# Patient Record
Sex: Male | Born: 1935 | Race: White | Hispanic: No | Marital: Married
Health system: Southern US, Community
[De-identification: ages and names within clinical notes are randomized; demographics above are authoritative.]

---

## 2001-09-24 ENCOUNTER — Ambulatory Visit (HOSPITAL_COMMUNITY): Admission: RE | Admit: 2001-09-24 | Discharge: 2001-09-24 | Payer: Self-pay | Admitting: Gastroenterology

## 2001-09-24 ENCOUNTER — Encounter (INDEPENDENT_AMBULATORY_CARE_PROVIDER_SITE_OTHER): Payer: Self-pay | Admitting: Specialist

## 2002-04-25 ENCOUNTER — Ambulatory Visit (HOSPITAL_COMMUNITY): Admission: RE | Admit: 2002-04-25 | Discharge: 2002-04-25 | Payer: Self-pay | Admitting: Endocrinology

## 2004-10-05 ENCOUNTER — Ambulatory Visit: Payer: Self-pay

## 2005-05-26 ENCOUNTER — Ambulatory Visit: Payer: Self-pay

## 2005-09-26 ENCOUNTER — Ambulatory Visit: Payer: Self-pay | Admitting: Internal Medicine

## 2005-11-01 ENCOUNTER — Ambulatory Visit: Payer: Self-pay | Admitting: Internal Medicine

## 2005-11-15 ENCOUNTER — Ambulatory Visit: Payer: Self-pay | Admitting: Pulmonary Disease

## 2006-01-05 ENCOUNTER — Ambulatory Visit: Payer: Self-pay | Admitting: Internal Medicine

## 2006-02-03 ENCOUNTER — Ambulatory Visit: Payer: Self-pay | Admitting: Internal Medicine

## 2006-02-09 ENCOUNTER — Ambulatory Visit: Payer: Self-pay

## 2006-03-22 ENCOUNTER — Ambulatory Visit: Payer: Self-pay | Admitting: Internal Medicine

## 2006-04-24 ENCOUNTER — Ambulatory Visit: Payer: Self-pay | Admitting: Internal Medicine

## 2007-04-25 ENCOUNTER — Ambulatory Visit: Payer: Self-pay | Admitting: Internal Medicine

## 2007-06-06 ENCOUNTER — Ambulatory Visit: Payer: Self-pay | Admitting: Internal Medicine

## 2007-08-11 ENCOUNTER — Other Ambulatory Visit: Payer: Self-pay

## 2007-08-11 ENCOUNTER — Emergency Department: Payer: Self-pay | Admitting: Emergency Medicine

## 2007-08-22 ENCOUNTER — Emergency Department: Payer: Self-pay | Admitting: Internal Medicine

## 2007-08-22 ENCOUNTER — Other Ambulatory Visit: Payer: Self-pay

## 2007-08-31 ENCOUNTER — Ambulatory Visit: Payer: Self-pay | Admitting: Family Medicine

## 2007-10-15 ENCOUNTER — Encounter: Payer: Self-pay | Admitting: Internal Medicine

## 2007-10-15 DIAGNOSIS — J45909 Unspecified asthma, uncomplicated: Secondary | ICD-10-CM | POA: Insufficient documentation

## 2007-10-15 DIAGNOSIS — J4489 Other specified chronic obstructive pulmonary disease: Secondary | ICD-10-CM | POA: Insufficient documentation

## 2007-10-15 DIAGNOSIS — J9600 Acute respiratory failure, unspecified whether with hypoxia or hypercapnia: Secondary | ICD-10-CM

## 2007-10-15 DIAGNOSIS — E119 Type 2 diabetes mellitus without complications: Secondary | ICD-10-CM | POA: Insufficient documentation

## 2007-10-15 DIAGNOSIS — J449 Chronic obstructive pulmonary disease, unspecified: Secondary | ICD-10-CM

## 2007-10-15 DIAGNOSIS — E669 Obesity, unspecified: Secondary | ICD-10-CM

## 2008-06-11 ENCOUNTER — Ambulatory Visit: Payer: Self-pay | Admitting: Specialist

## 2010-12-03 ENCOUNTER — Encounter: Payer: Self-pay | Admitting: Otolaryngology

## 2010-12-23 ENCOUNTER — Encounter: Payer: Self-pay | Admitting: Otolaryngology

## 2011-03-08 NOTE — Assessment & Plan Note (Signed)
[facility]                             PULMONARY OFFICE NOTE   [name]                       MRN:          [id number]  DATE:04/25/2007                            DOB:          12-06-1923    HISTORY:  This is an 77 year old white male previously seen on April 24, 2006 with a diagnosis of COPD who did in fact have very mild airflow  obstruction by PFTs, but the predominant finding was one of morbid  obesity with deconditioning and probable reflux mimicking asthma. I had  recommended elimination of all respiratory medicines except for  nocturnal oxygen, and continue treatment for reflux. His treatment for  reflux changed some time ago to Zantac 150 mg every morning. His  breathing has worsened over the last 6 months to the point where he is  having trouble getting across the room without wheezing. He denies any  nocturnal symptoms and for that reason is not longer using oxygen but  says he feels terrible in the morning until he gets his arm and leg  exercises in (apparently he exercises on his back).   He denies any pleuritic pain, fevers, chills, sweats, or fluctuations in  his symptoms with weather or environmental changes, any obvious  alleviating or aggravating factors. He denies any chest pain, fevers,  chills, sweats, orthopnea, PND, or leg swelling.   For full inventory of medications please see face sheet dated April 25, 2007. His wife carries all of his medications on a card that is about  half of a 3 x 5, where as previously he was provided a full page  medication calendar here by a nurse practitioner that he no longer uses.   PHYSICAL EXAMINATION:  He is an ambulatory, obese white male in no acute  distress. Weight 272 pounds which is actually down 13 pounds from  previous visit. Blood pressure is 132/74.  HEENT: Unremarkable. Pharynx clear.  LUNG FIELDS: Completely clear bilaterally to auscultation and percussion  except for  prominent pseudo wheeze.  There is a regular rate and rhythm without murmur, gallop, or rub.  ABDOMEN: Obese, but otherwise benign with poor excursion noted in the  supine position.  EXTREMITIES: Warm without calf tenderness, cyanosis, clubbing. There was  trace edema.   Chest x-ray is pending. We walked the patient around the patient for a  full lap (185 feet). He did not desaturate but said he felt tired and  unable to continue at the end of the lap. He did not appear tachypneic.   IMPRESSION:  Worsening dyspnea over 6 months associated with pseudo  wheeze on exam. We could not produce desaturation on activity, although  I suspect he is still desaturating at night and recommended that he have  repeat pulse oximetry performed and oxygen supplied if his sats are 88%  or less for more than 5 minutes of the study.   In terms of improving his daytime function, I recommend the following;  Since the prominent pseudo wheeze was present previously on ACE  inhibitors that would indicate to me vocal cord dysfunction  that may be  exacerbated by reflux. I recommended restarting Prilosec perfectly  regular before meals and reviewed a diet with him. I asked him to  continue Zantac but take it at bedtime instead of in the morning.   I would like to see him back in 6 weeks for a full set of PFTs to  compare to previous study, which I think was actually done while on  bronchodilators to see if he is losing ground which indicate an  asthmatic component that is not being driven by reflux, but primary  airways disease which might be amenable to more aggressive treatment.   In the meantime I believe the patient needs to learn to exercise at a  pace where he is comfortable but not out of breath, 30 minutes daily.  Exercising on his back in the morning is going to be of no value in  this regard and I have advised him of this.     Charlaine Dalton. Sherene Sires, MD, [facility]  Electronically Signed    MBW/MedQ  DD:  04/25/2007  DT: 04/25/2007  Job #: [id number]

## 2011-03-08 NOTE — Assessment & Plan Note (Signed)
[facility]                             PULMONARY OFFICE NOTE   SANDEEP, RADELL                       MRN:          [id number]  DATE:06/06/2007                            DOB:          09-06-1924    HISTORY:  An 77 year old white male with a clinical diagnosis of COPD  but never smoked and returns now for a set of PFTs, reporting that he is  markedly improved off of all inhalers and using Protonix 40 mg before  breakfast, Zantac 75 mg tablets two at bedtime.  He denies any  significant cough, variability with dyspnea, fevers, chills, sweats,  orthopnea, PND or leg swelling.  For a full inventory of his  medications, please see Face Sheet, dated June 06, 2007, noting that  it correlates very well with the medication calendar he carries with  him.   PHYSICAL EXAMINATION:  He is a pleasant ambulatory white male in no  acute distress.  He is afebrile with normal vital signs.  HEENT:  Unremarkable.  OROPHARYNX:  Clear.  LUNG FIELDS:  Diminished breath sounds bilaterally, no wheezing.  HEART:  Regular rate and rhythm without murmur, gallop or rub.  ABDOMEN:  Soft but obese, benign.  EXTREMITIES:  Warm without any calf tenderness, cyanosis, clubbing or  edema.   PFTs were performed today and compared to previous study done a year and  a half ago and indicated an FEV1 at 2.08L, which is better than previous  visit, and no change in vital capacity or diffusing capacity which are  all normal.  The main finding is one of disproportionate reduction and  expiratory reserve volume at 38%.  Note that the PFTs done today were  off of all bronchodilators and he did not have a significant  bronchodilator response.   IMPRESSION:  No evidence of significant reversible airflow obstruction.  For that matter, no significant chronic obstructive pulmonary disease  either.  I believe most of his condition is obesity with deconditioning  and I explained this to him  and his wife and the strong recommendation  that he begin exercising to a level where he is short of breath but not  out of breath for 30 minutes daily.  If not improved over the next 6  weeks, we might consider proceeding with a CT scan of the chest to  exclude occult interstitial lung disease or thromboembolic disease, but  these are both very unlikely given the fact that his diffusion capacity  is 85%.   He does have significant resting tremor.  I would caution that one of  the issues here is that he may in fact have some form of chronic asthma  with an irreversible component that previously masqueraded as COPD and  could easily exacerbate if reflux is not well controlled or he ends up  on a nonspecific beta-blocker like propranolol (which may actually be  the case because he remembered getting something previously for tremor  but does not remember who gave it to him or what exactly it was.  I  would avoid beta-blockers in this setting.  Pulmonary followup at this point can be as needed if the patient is not  convinced he is improving with regular exercise.   In terms of his oxygen requirements, presently he does not need it at  rest or with exercise but clearly desaturates at night probably related  again to obesity and should continue this until significant weight loss  and a repeat study is done off of oxygen to verify that he has less than  5 minutes total of desaturation less than 89.     Charlaine Dalton. Sherene Sires, MD, [facility]  Electronically Signed    MBW/MedQ  DD: 06/06/2007  DT: 06/07/2007  Job #: [id number]   cc:   Alfonse Alpers. Dagoberto Ligas, M.D.  Georga Hacking, M.D.

## 2011-04-22 ENCOUNTER — Inpatient Hospital Stay: Payer: Self-pay | Admitting: Internal Medicine

## 2011-07-28 ENCOUNTER — Emergency Department: Payer: Self-pay | Admitting: Emergency Medicine

## 2012-05-14 ENCOUNTER — Inpatient Hospital Stay: Payer: Self-pay | Admitting: Specialist

## 2012-05-14 LAB — COMPREHENSIVE METABOLIC PANEL
Albumin: 3 g/dL — ABNORMAL LOW (ref 3.4–5.0)
Alkaline Phosphatase: 88 U/L (ref 50–136)
Anion Gap: 3 — ABNORMAL LOW (ref 7–16)
BUN: 21 mg/dL — ABNORMAL HIGH (ref 7–18)
Glucose: 101 mg/dL — ABNORMAL HIGH (ref 65–99)
Osmolality: 294 (ref 275–301)
Potassium: 4.2 mmol/L (ref 3.5–5.1)
SGOT(AST): 22 U/L (ref 15–37)
Sodium: 146 mmol/L — ABNORMAL HIGH (ref 136–145)
Total Protein: 7.7 g/dL (ref 6.4–8.2)

## 2012-05-14 LAB — CBC WITH DIFFERENTIAL/PLATELET
Basophil %: 0.3 %
Eosinophil %: 2.4 %
HGB: 13.9 g/dL (ref 13.0–18.0)
Lymphocyte %: 14.2 %
MCH: 28.9 pg (ref 26.0–34.0)
Monocyte %: 6.7 %
Neutrophil %: 76.4 %
RBC: 4.8 10*6/uL (ref 4.40–5.90)
WBC: 9.6 10*3/uL (ref 3.8–10.6)

## 2012-05-14 LAB — PRO B NATRIURETIC PEPTIDE: B-Type Natriuretic Peptide: 284 pg/mL (ref 0–450)

## 2012-05-15 LAB — CBC WITH DIFFERENTIAL/PLATELET
Eosinophil %: 0.1 %
HCT: 38.5 % — ABNORMAL LOW (ref 40.0–52.0)
Lymphocyte #: 0.5 10*3/uL — ABNORMAL LOW (ref 1.0–3.6)
MCHC: 32.2 g/dL (ref 32.0–36.0)
MCV: 92 fL (ref 80–100)
Platelet: 200 10*3/uL (ref 150–440)
RDW: 14.4 % (ref 11.5–14.5)

## 2012-05-15 LAB — BASIC METABOLIC PANEL
Anion Gap: 4 — ABNORMAL LOW (ref 7–16)
BUN: 20 mg/dL — ABNORMAL HIGH (ref 7–18)
Calcium, Total: 8 mg/dL — ABNORMAL LOW (ref 8.5–10.1)
Chloride: 108 mmol/L — ABNORMAL HIGH (ref 98–107)
Osmolality: 295 (ref 275–301)
Potassium: 4.2 mmol/L (ref 3.5–5.1)
Sodium: 144 mmol/L (ref 136–145)

## 2012-05-20 LAB — CULTURE, BLOOD (SINGLE)

## 2012-10-27 IMAGING — US US EXTREM LOW VENOUS*L*
1 series · 14 of 24 positions shown · non-contrast
Comparison: none

REASON FOR EXAM: swelling of left leg
COMMENTS:

[Series 1: us extrem low venous*left* · 0.13mm/px · 14 of 24 slices shown]
[im 1/24]
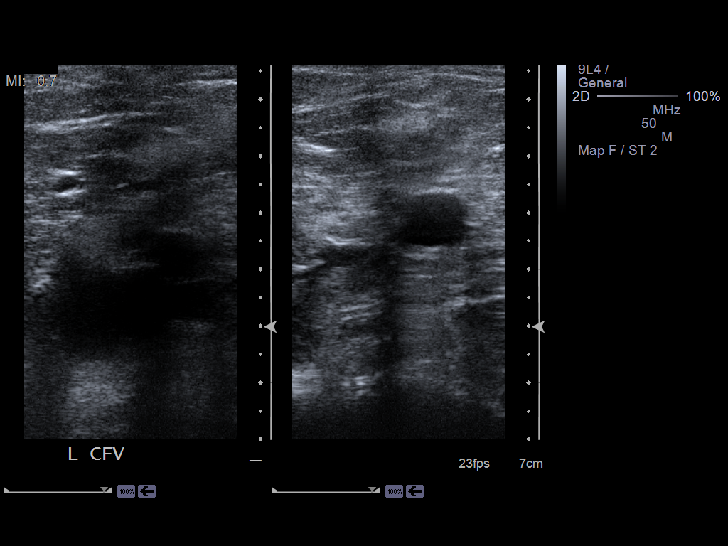
[im 3/24]
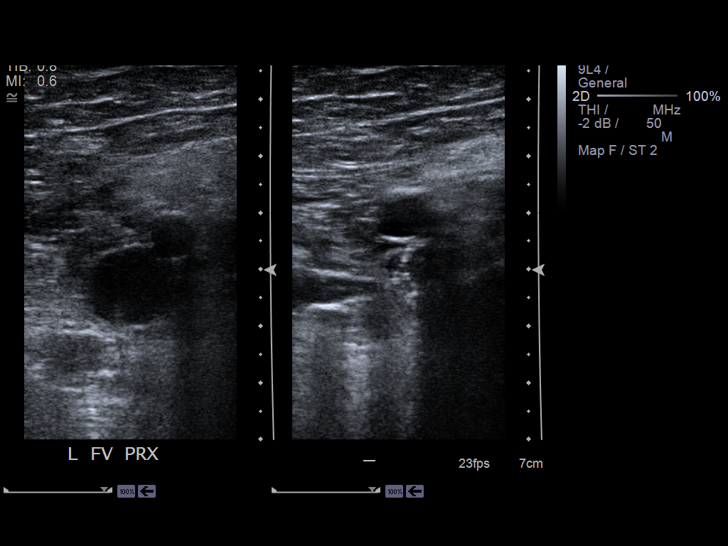
[im 5/24]
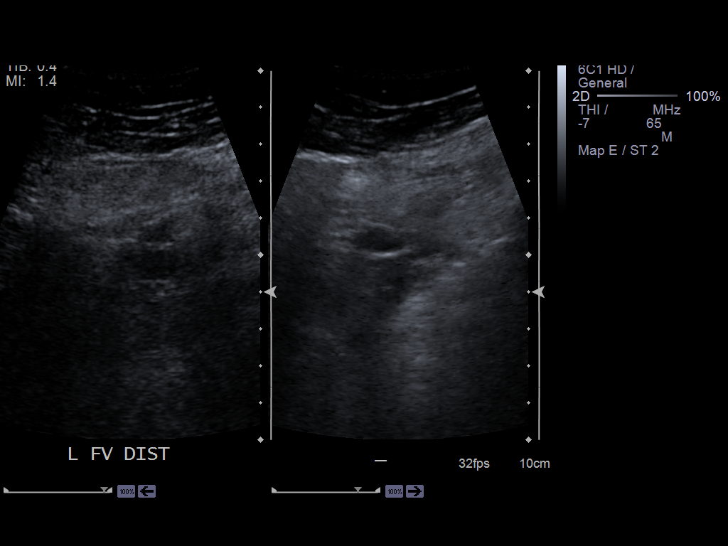
[im 7/24]
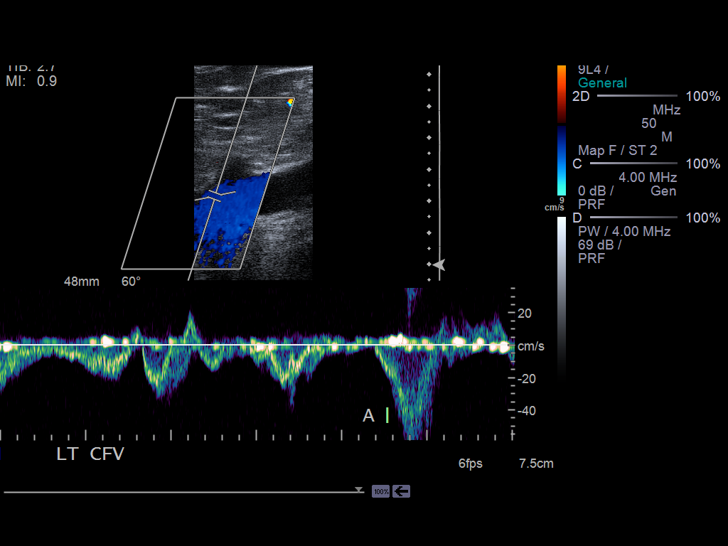
[im 8/24]
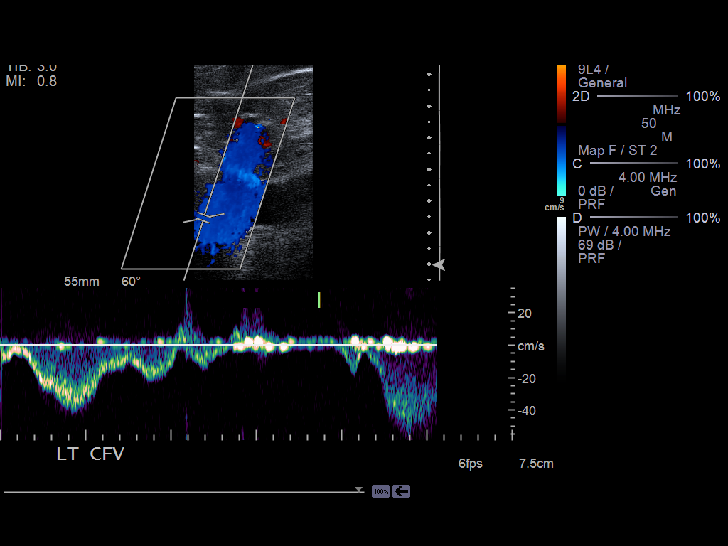
[im 10/24]
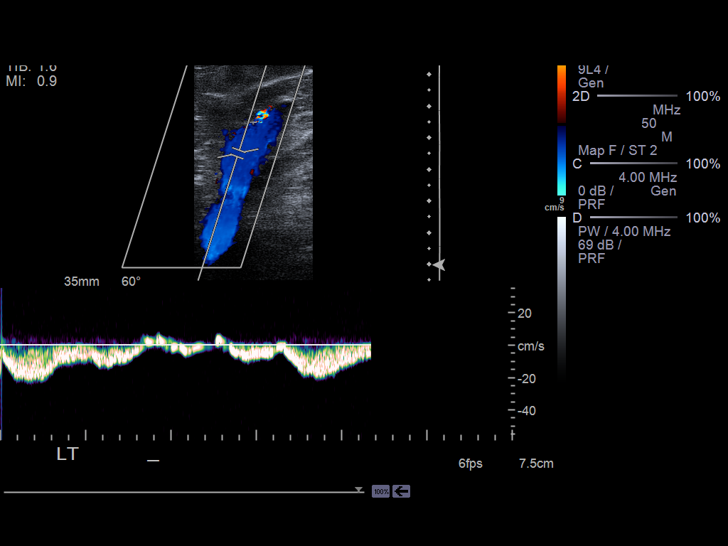
[im 12/24]
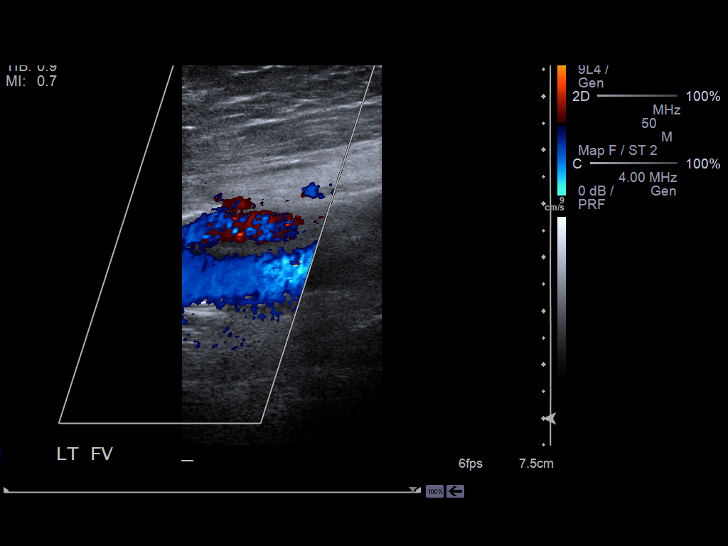
[im 13/24]
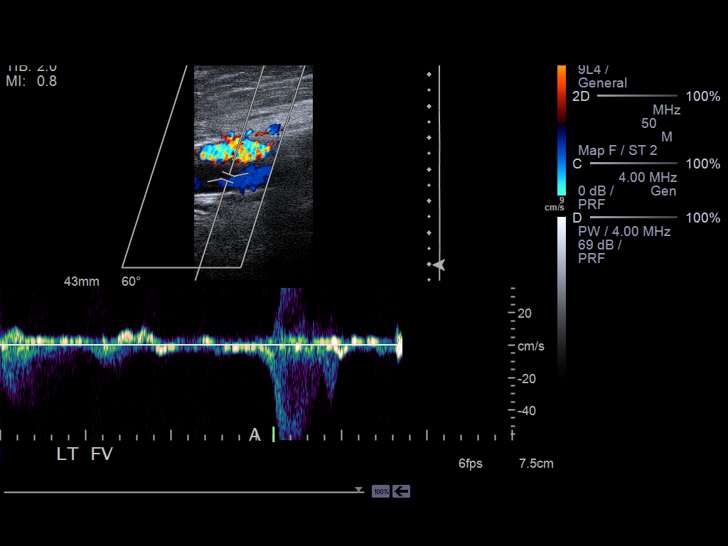
[im 15/24]
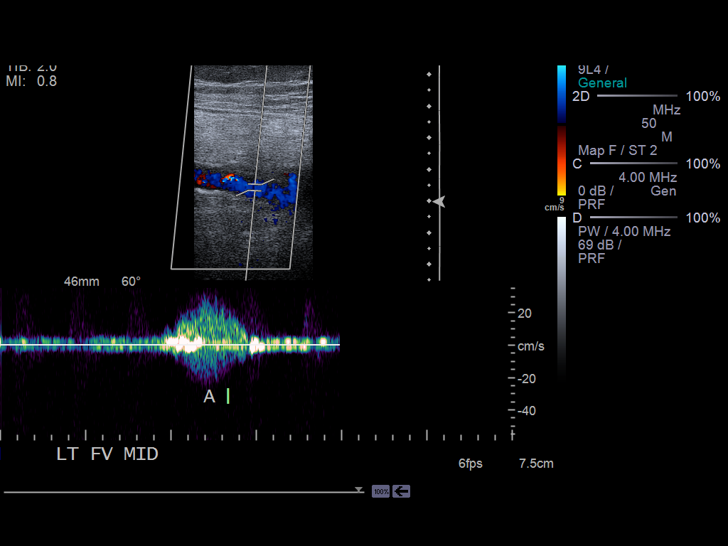
[im 17/24]
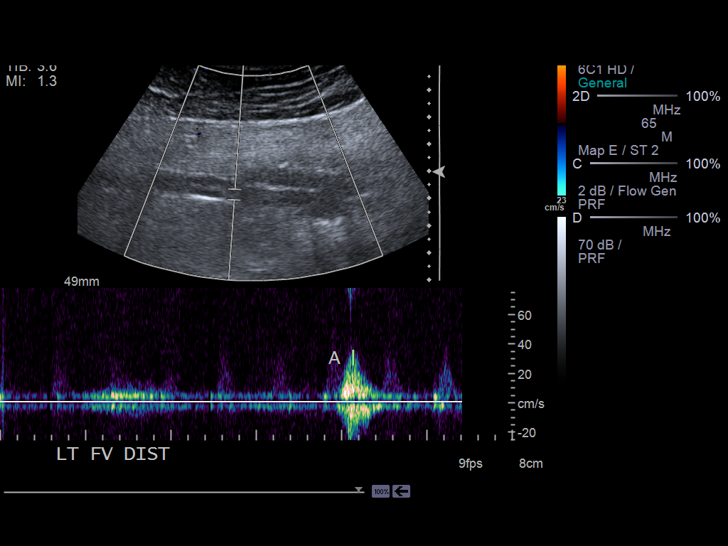
[im 19/24]
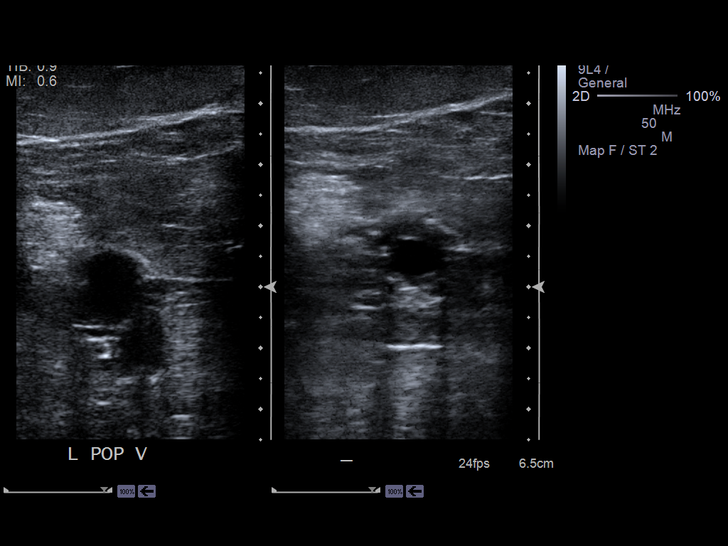
[im 20/24]
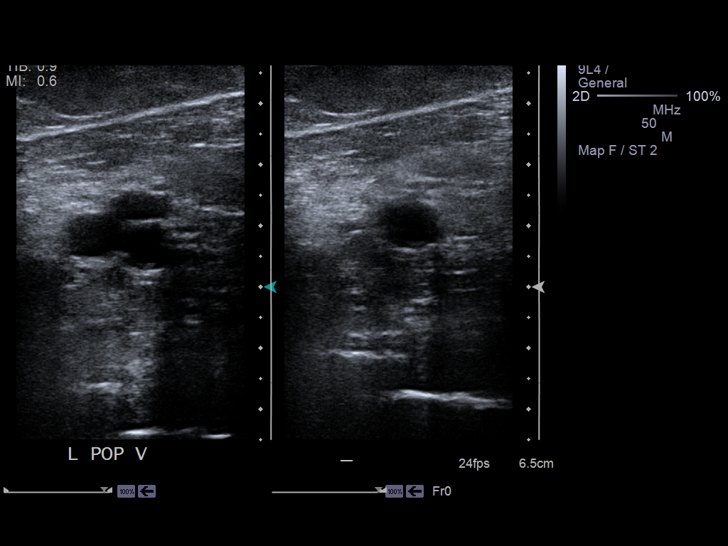
[im 22/24]
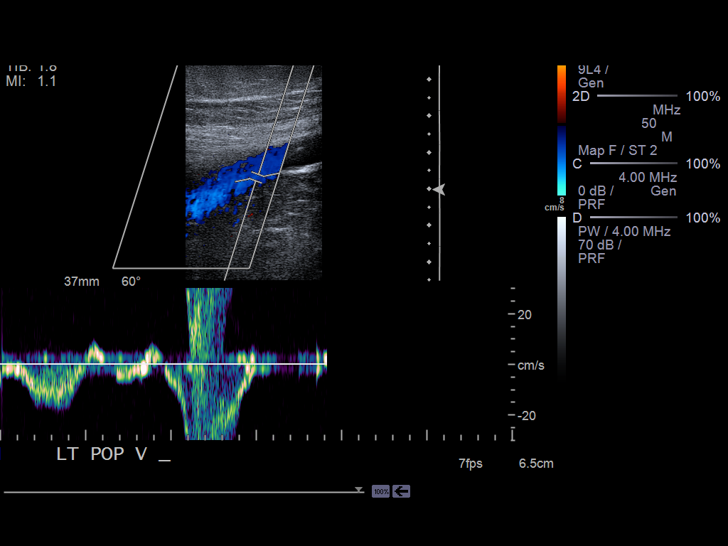
[im 24/24]
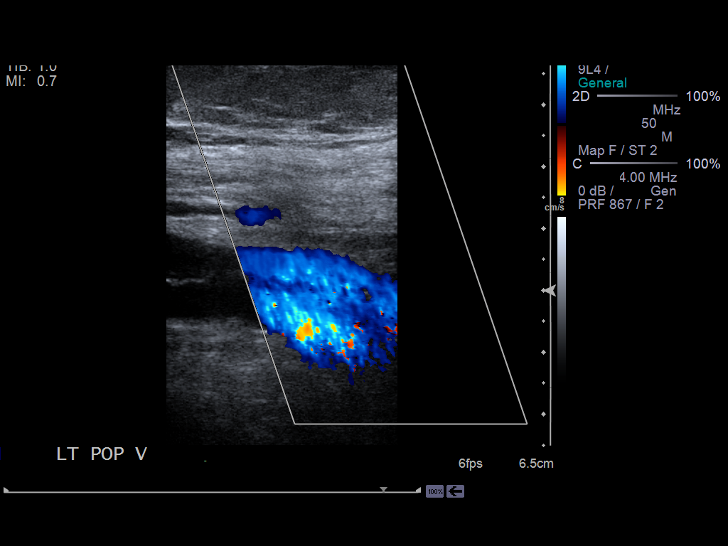

[14 of 24 positions shown; findings below may reference images not displayed]

PROCEDURE:     US  - US DOPPLER LOW EXTR LEFT  - May 15, 2012  [DATE]

RESULT:     Doppler interrogation of the deep venous system of the left leg
is performed from the common femoral vein through the popliteal vein.
Grayscale images showed normal compressibility of the deep venous
structures. Color and spectral Doppler interrogation shows a normal
appearance.
IMPRESSION: 1. No evidence of left lower extremity deep vein thrombosis. No popliteal
fossa cyst evident.

[REDACTED]

## 2012-10-29 LAB — CBC
MCH: 27.4 pg (ref 26.0–34.0)
MCHC: 30.1 g/dL — ABNORMAL LOW (ref 32.0–36.0)
Platelet: 221 10*3/uL (ref 150–440)
RBC: 4.2 10*6/uL — ABNORMAL LOW (ref 4.40–5.90)
RDW: 15.7 % — ABNORMAL HIGH (ref 11.5–14.5)

## 2012-10-29 LAB — COMPREHENSIVE METABOLIC PANEL
Albumin: 2.6 g/dL — ABNORMAL LOW (ref 3.4–5.0)
Anion Gap: 4 — ABNORMAL LOW (ref 7–16)
Calcium, Total: 8.7 mg/dL (ref 8.5–10.1)
Chloride: 104 mmol/L (ref 98–107)
Co2: 35 mmol/L — ABNORMAL HIGH (ref 21–32)
Creatinine: 1.19 mg/dL (ref 0.60–1.30)
Glucose: 174 mg/dL — ABNORMAL HIGH (ref 65–99)
Osmolality: 290 (ref 275–301)
Potassium: 4.1 mmol/L (ref 3.5–5.1)
SGOT(AST): 15 U/L (ref 15–37)
SGPT (ALT): 10 U/L — ABNORMAL LOW (ref 12–78)
Sodium: 143 mmol/L (ref 136–145)

## 2012-10-29 LAB — URINALYSIS, COMPLETE
Bilirubin,UR: NEGATIVE
Glucose,UR: NEGATIVE mg/dL (ref 0–75)
Ketone: NEGATIVE
Nitrite: POSITIVE
Ph: 5 (ref 4.5–8.0)
Protein: 30
Specific Gravity: 1.012 (ref 1.003–1.030)
Squamous Epithelial: <1
WBC UR: 201 /HPF (ref 0–5)

## 2012-10-30 ENCOUNTER — Inpatient Hospital Stay: Payer: Self-pay | Admitting: Internal Medicine

## 2012-10-31 LAB — CBC WITH DIFFERENTIAL/PLATELET
Basophil #: 0 10*3/uL (ref 0.0–0.1)
Basophil %: 0.2 %
Eosinophil #: 0 10*3/uL (ref 0.0–0.7)
Eosinophil %: 0.4 %
HGB: 10.1 g/dL — ABNORMAL LOW (ref 13.0–18.0)
Lymphocyte #: 1.6 10*3/uL (ref 1.0–3.6)
MCH: 28.5 pg (ref 26.0–34.0)
MCV: 90 fL (ref 80–100)
Monocyte #: 0.6 x10 3/mm (ref 0.2–1.0)
Monocyte %: 7.3 %
Neutrophil %: 72.2 %
Platelet: 205 10*3/uL (ref 150–440)
RBC: 3.54 10*6/uL — ABNORMAL LOW (ref 4.40–5.90)

## 2012-10-31 LAB — BASIC METABOLIC PANEL
Anion Gap: 5 — ABNORMAL LOW (ref 7–16)
Calcium, Total: 8.4 mg/dL — ABNORMAL LOW (ref 8.5–10.1)
Chloride: 105 mmol/L (ref 98–107)
Co2: 35 mmol/L — ABNORMAL HIGH (ref 21–32)
EGFR (Non-African Amer.): >60
Glucose: 98 mg/dL (ref 65–99)
Sodium: 145 mmol/L (ref 136–145)

## 2012-11-04 LAB — CULTURE, BLOOD (SINGLE)

## 2013-02-21 DEATH — deceased

## 2013-04-12 IMAGING — CR DG CHEST 1V PORT
1 series · 1 of 1 positions shown · non-contrast
Comparison: none

REASON FOR EXAM: SOB
COMMENTS:

PROCEDURE:     DXR - DXR PORTABLE CHEST SINGLE VIEW  - October 29, 2012 [DATE]
RESULT:     Comparison: 05/14/2012

[ap]
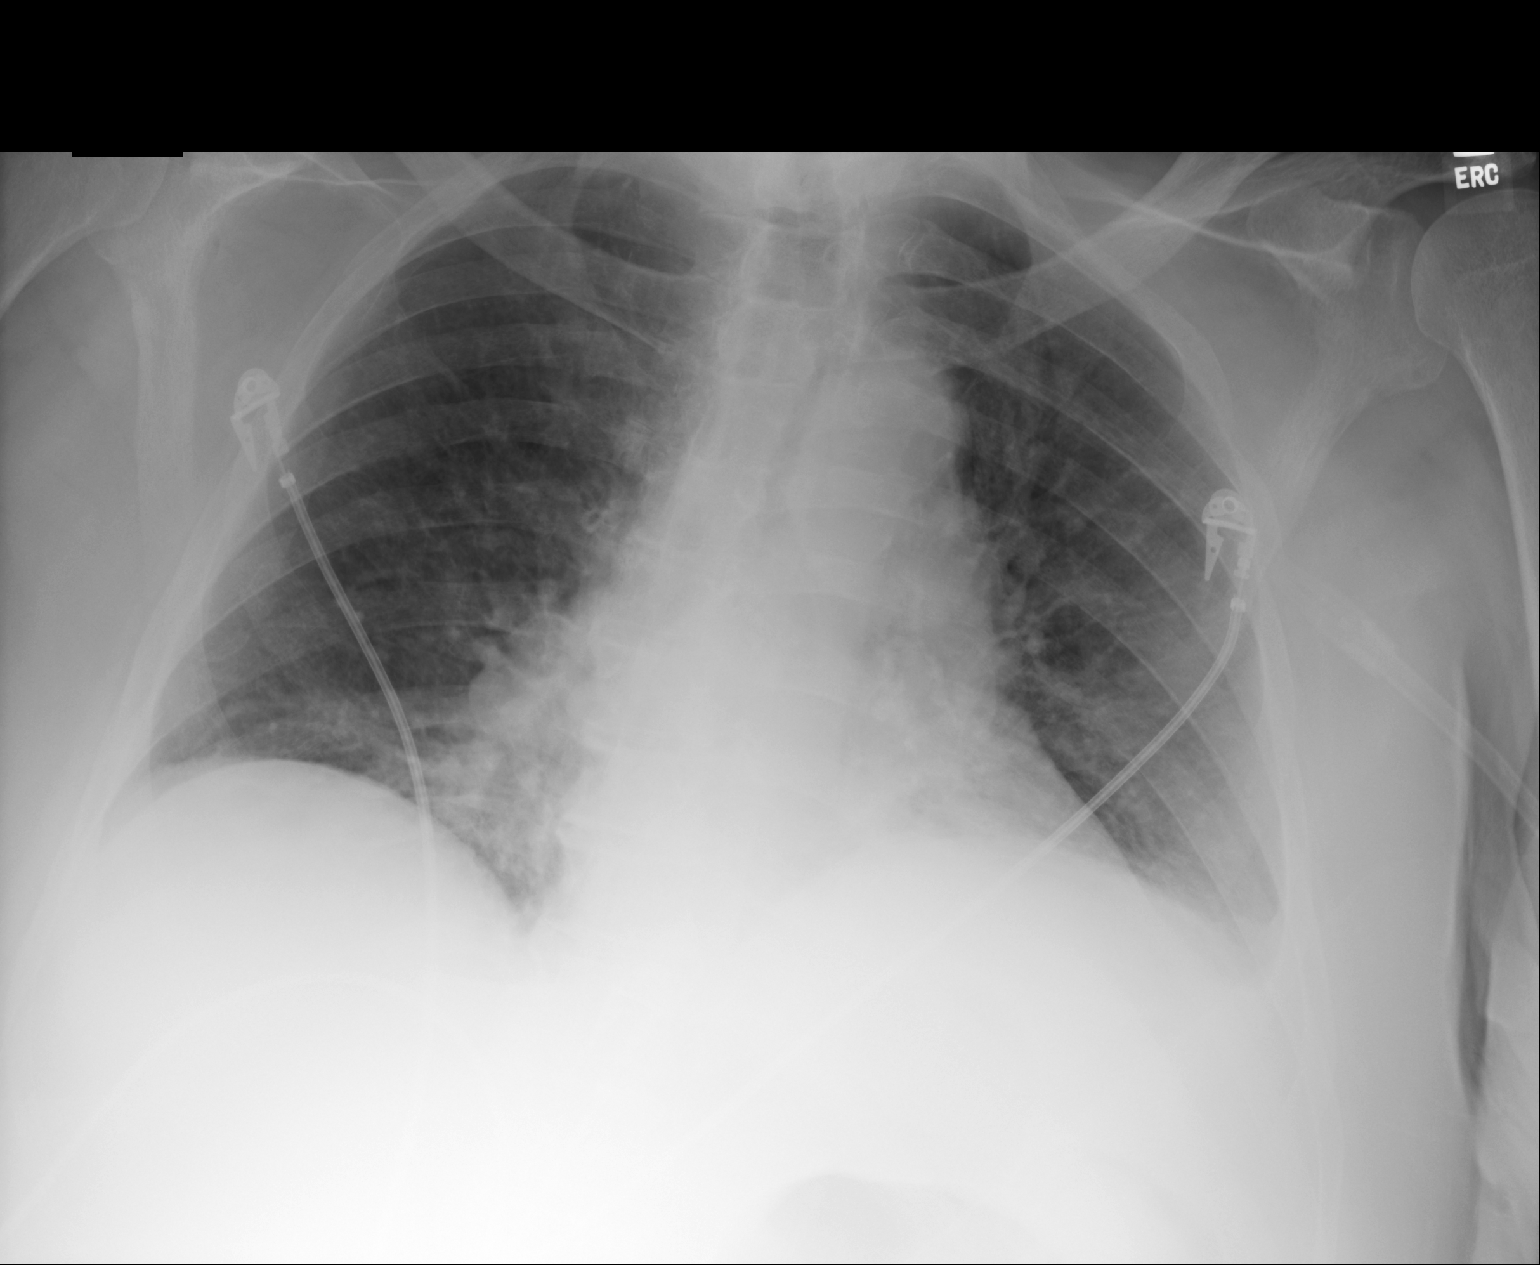

[1 of 1 positions shown; findings below may reference images not displayed]

FINDINGS: Single portable AP chest radiograph is provided. There is right lower lobe
airspace disease concerning for pneumonia. There is no pleural effusion or
pneumothorax. There is mild bilateral interstitial thickening. Normal
cardiomediastinal silhouette. The osseous structures are unremarkable.
IMPRESSION: Right lower lobe airspace disease concerning for pneumonia. Recommend
dedicated PA and lateral views of the che[REDACTED]

## 2015-02-10 NOTE — Discharge Summary (Signed)
PATIENT NAME:  [name], [name] MR#:  [id number] DATE OF BIRTH:  07/11/1924  DATE OF ADMISSION:  05/14/2012 DATE OF DISCHARGE:  05/16/2012  For a detailed note, please take a look at the history and physical done on admission by Dr. Auburn BilberryShreyang Patel.  DISCHARGE DIAGNOSES: 1. Left lower extremity cellulitis and pressure ulcer.  2. Chronic obstructive pulmonary disease with mild exacerbation.  3. History of underlying Parkinson's disease.  4. Benign prostatic hypertrophy.  5. Depression.   DIET: The patient is being discharged on a low-sodium diet.   ACTIVITY: As tolerated.   DISPOSITION: The patient is being discharged home with home health nursing and aid services.   DISCHARGE FOLLOWUP: Followup with Dr. Recardo EvangelistMatthew Troxler in 1 to 2 weeks. [facility], Rolin BarryMario Olmedo.   DISCHARGE MEDICATIONS:  1. Celexa 20 mg daily.  2. Aspirin 81 mg daily.  3. Advair 250/50 one puff twice a day. 4. Lasix 20 mg daily.  5. Flomax 0.4 mg daily.  6. Q-Var 40 mcg 2 puffs twice a day. 7. Vitamin B12 1000 mcg daily.  8. Seroquel 50 mg 1/2 tab at bedtime.  9. Augmentin 500/125 mg one tab twice a day x7 days. 10. Prednisone taper starting at 40 mg down to 10 mg over the next four days. 11. Bactroban to be applied to the left heel ulcer with heavy gauze dressing and use of pressure boots.  CONSULTANTS: Recardo EvangelistMatthew Troxler, MD - Podiatry.   PERTINENT STUDIES: Chest x-ray done on admission showed no evidence of acute cardiopulmonary disease.  X-ray of the left foot showed osteopenia without evidence of any acute osseous abnormalities.   Ultrasound of the left lower extremity showed no evidence of any deep vein thrombosis.   HOSPITAL COURSE: This is an 81 year old male with medical problems as mentioned above who presented to the hospital with left foot cellulitis and also mild chronic obstructive pulmonary disease exacerbation.  1. Left foot cellulitis/ulcer. The patient was  empirically started on IV vancomycin and Unasyn. His blood cultures remained negative. His white cell count was normal. He remained afebrile. A podiatry consult was obtained. The patient underwent local wound care with dressing changes as per podiatry. He is being discharged on that, on the Bactroban, with a heavy gauze dressing along with the use of pressure boots. Dr. Orland Jarredroxler is going to evaluate his wound in a week or so. He is being discharged empirically on p.o. Augmentin for the next five days.  2. Chronic obstructive pulmonary disease exacerbation. The patient was started on IV steroids and some nebulizer treatments. His wheezing and clinical symptoms have significantly improved. For now he will be discharged on a quick prednisone taper along with continuation of his Advair as stated.  3. Benign prostatic hypertrophy. The patient was maintained on his Flomax. He will resume that.  4. Depression. The patient was maintained on Celexa and Seroquel. He will resume that upon discharge too.   CODE STATUS: FULL CODE.           He is being discharged home with home health nursing and aide services.   TIME SPENT: 40 minutes. ____________________________ Rolly PancakeVivek J. Cherlynn KaiserSainani, MD vjs:slb D: 05/16/2012 15:53:38 ET T: 05/17/2012 11:56:08 ET JOB#: [id number]  cc: Rolly PancakeVivek J. Cherlynn KaiserSainani, MD, <Dictator> Dione HousekeeperMario Ernesto Olmedo, MD Houston SirenVIVEK J SAINANI MD ELECTRONICALLY SIGNED 05/18/2012 16:19

## 2015-02-10 NOTE — Consult Note (Signed)
PATIENT NAME:  [name], [name] MR#:  [id number] DATE OF BIRTH:  08/25/1924  DATE OF CONSULTATION:  05/14/2012  REFERRING PHYSICIAN:   CONSULTING PHYSICIAN:  Rhona RaiderMatthew G. Hamzah Savoca, DPM  REASON FOR CONSULTATION: Left heel ulcer.  HISTORY OF PRESENT ILLNESS: The patient is an 81 year old male, he is known to me. I have treated him before in the office for diabetic related foot problems. He was admitted with cellulitis to his left leg, also with some shortness of breath associated with his congestive heart failure. He is a type II diabetic and developed a decubitus ulcer on his left heel. He also has Parkinson's disease and he is not able to really ambulate very much now and he is in bed a lot and that is probably what caused the pressure sore to develop.  PAST MEDICAL HISTORY:  1. Chronic obstructive pulmonary disease. 2. Sleep apnea, intolerant to CPAP. Uses oxygen at nighttime. 3. Congestive heart failure. 4. Type 2 diabetes. 5. Peripheral neuropathy. 6. Parkinson's disease. 7. Reflux.   PAST SURGICAL HISTORY:  1. Bilateral knee replacement, in 2003 and 2004. 2[place]  3. Status post cataract surgery.   ALLERGIES: No known drug allergies.   CURRENT MEDICATIONS: Advair, aspirin, Citalopram, Lasix, Q-var, Seroquel, Flomax, vitamin B12 and albuterol/Atrovent inhaler.   SOCIAL HISTORY: Unremarkable.   FAMILY HISTORY: Positive for diabetes, cancer, cerebrovascular accident, and coronary artery disease. Father died at 8390, mother at 794.  PHYSICAL EXAMINATION:  GENERAL:  He is alert and is oriented well. He is able to communicate and recognize me fairly well. His daughter is in the room with him at this timeframe.  VITALS: Right now his vital signs are stable with a temperature of 97.4, pulse 76, respirations 20, blood pressure 151/66, and pulse oximetry 93.  LOWER EXTREMITY EXAM: Vascular DP and PT pulses are trace bilaterally.   DERMATOLOGIC: The patient has large  decubitus blister on his right heel; it is about 6 cm in diameter.   I did debride off the superficial portion of it so I could assess the deeper aspect of it. It appears to be superficial at this point, but we will have to see what type of deeper tissue damage may have occurred with this as well. He is wearing heel protector boots at this given time. His cellulitis is fairly minimal. I really do not see any spreading cellulitis around the area. He seems to be responding to antibiotics very well.   ORTHO: The patient is basically nonambulatory at this point. He has some contractures of his feet and legs secondary to his parkinsonism including intramuscular weakness and contractures across the toes and the metatarsophalangeal joints.   RESULTS: Lab results show that his white count is within normal limits at 8.1 and hemoglobin is a  little low at 12.4. BUN is elevated slightly at 20. Glucose is 201, serum, today. Hemoglobin A1c is 6.4.   CLINICAL IMPRESSION: Decubitus ulcer, left heel, 6 cm in diameter. No real progressing cellulitis around the region. The wound appears to be superficial, but will need to be followed and protected to prevent further damage.   TREATMENT PLAN: Debride and clean the area today and apply heavy gauze padding with Kerlix and moist dressing. Continue to use the heel protector boots at all times. Hopefully we will be sending those home with him.  Recommend home health care to change the dressing daily with Bactroban ointment and heavy gauze padding around the region. Can use a little saline  or hydrogel type dressing in conjunction with the Bactroban to keep the area moist and promote more healing to the region. Recommend I follow him up in the office in a couple of weeks for recheck and see how well this is doing.  ____________________________ Rhona Raider. Baylyn Sickles, DPM mgt:slb D: 05/15/2012 12:14:48 ET T: 05/15/2012 13:04:46 ET JOB#: [id number]  cc: Rhona Raider Eustacio Ellen, DPM,  <Dictator> Epimenio Sarin MD ELECTRONICALLY SIGNED 05/21/2012 13:16

## 2015-02-10 NOTE — H&P (Signed)
PATIENT NAME:  [name], [name] MR#:  [id number] DATE OF BIRTH:  1924-09-04  DATE OF ADMISSION:  05/14/2012  PRIMARY CARE PHYSICIAN: Jerrilyn Cairouke [place]  ED REFERRING PHYSICIAN: Dr. Jene Everyobert Kinner   CHIEF COMPLAINT: Left leg erythema, also area of drainage on the left lateral aspect of the left foot as well as shortness of breath.   HISTORY OF PRESENT ILLNESS: Patient is an 81 year old Caucasian male with history of chronic obstructive pulmonary disease who is on oxygen mostly at nighttime, also has sleep apnea, history of diastolic congestive heart failure, diabetes type 2 diet controlled, peripheral neuropathy and Parkinson's disease who is sent from his primary care's office due to concern for left leg cellulitis. Patient is not a very good historian. His wife is the one that gives me all the history. She reports that last Saturday she noticed an area on the patient's lateral aspect of his left foot an area of blister that popped up all of a sudden and she initially noticed there was fluid and then subsequently she started noticing that there was change in the color, it looked more bloody and then started draining. He was seen by his primary care provider. He was also noticed to have erythema involving the left lower leg as well as warmth so he was sent here for further evaluation. On further questioning his wife also reports he has been having more shortness of breath, having more frequency in wheezing. Has not had any significant cough or fevers. She reports that he does use oxygen at nighttime but he has been using it at daytime as well because of this. Patient also has been having dyspnea with any exertion. He reports that he also gets chest pressure with exertion. He has not had any fevers or chills. No nocturnal dyspnea. According to his wife he also has chronic left lower extremity swelling compared to the right leg.   PAST MEDICAL HISTORY:  1. Chronic obstructive pulmonary disease. He only uses O2 at  nighttime, has a nebulizer machine, however, his wife forgot that he had it and has not received that. 2. Sleep apnea, intolerant to CPAP, only uses O2 at nighttime. 3. History of diastolic congestive heart failure with ejection fraction greater than 60%.  4. Type 2 diabetes currently not on any medications.  5. Peripheral neuropathy.  6. Parkinson's disease. 7. History of ambulatory gait dysfunction. 8. Gastroesophageal reflux disease.   PAST SURGICAL HISTORY:  1. Status post bilateral knee replacement 2003 and 2004. 2. [place] 3. Status post cataract surgery.   ALLERGIES: None.   CURRENT MEDICATIONS AT HOME:  1. Advair 250/50, 1 INH at bedtime. 2. Aspirin 81, 1 tab p.o. daily.  3. Citalopram 20 daily.  4. Lasix 40 mg 0.5 to 1 tab daily as his wife feels appropriate for swelling. 5. Qvar 40 mcg 2 puffs b.i.d.  6. Seroquel 50 0.5 tab at bedtime.  7. Flomax 0.4 at bedtime.  8. Vitamin B12 1000 units 1 tab p.o. daily.  9. Albuterol Atrovent nebs as needed, however, has not been using this recently.   SOCIAL HISTORY: No smoking. No alcohol. No drug use. Lives with his wife.   FAMILY HISTORY: Positive for diabetes, CVA, cancer, coronary artery disease. Father died at 7190, mother died at 4494.   REVIEW OF SYSTEMS: CONSTITUTIONAL: Denies any fevers. Complains of fatigue, weakness. Complains of pain in the left leg. No weight loss. No weight gain. EYES: No blurred or double vision. No pain. No  redness. No inflammation. ENT: No tinnitus. No ear pain. No hearing loss. No difficulty swallowing. RESPIRATORY: Has wheezing, occasional cough. No hemoptysis. Has chronic dyspnea. Has diagnosis of chronic obstructive pulmonary disease. CARDIOVASCULAR: Denies some chest pressure with exertion. No orthopnea. Has edema in the lower extremity, left greater than right. No arrhythmias. No syncope. GASTROINTESTINAL: No nausea, vomiting, diarrhea. No abdominal pain. No hematemesis.  No melena. No gastroesophageal reflux disease. No irritable bowel syndrome. No jaundice. GENITOURINARY: Denies any dysuria, hematuria, renal calculus or frequency. ENDO: Denies any polyuria, nocturia, or thyroid problems. HEME/LYMPH: Denies anemia, easy bruisability, or bleeding. SKIN: No acne. Erythema involving the left leg which is new. No changes in mole, hair or skin. MUSCULOSKELETAL: Denies any pain in neck, back, or shoulder. Has chronic pain related to osteoarthritis. No gout. NEUROLOGIC: No numbness. No cerebrovascular accident. No transient ischemic attack. Has Parkinson's disease. PSYCHIATRIC: No anxiety, insomnia. No ADD.   PHYSICAL EXAMINATION:  VITAL SIGNS: Temperature 97.2, pulse 70, respirations 24, blood pressure 140/67, O2 96% on room air.   GENERAL: Patient is an elderly-appearing male appears chronically debilitated in no acute distress.   HEENT: Head atraumatic, normocephalic. Pupils equal, round, reactive to light and accommodation. There is no conjunctival pallor. No scleral icterus. Extraocular movements intact. Nasal exam shows no drainage, ulceration. Oropharynx is clear without any exudate.   NECK: No thyromegaly. No carotid bruits.   RESPIRATORY: Occasional wheezing. There is no accessory muscle usage. No crackles or rales.   CARDIOVASCULAR: Regular rate and rhythm. S1, S2 positive. No murmurs, rubs, clicks, or gallops. PMI is not displaced.   ABDOMEN: Soft, nontender, nondistended. Positive bowel sounds x4. There is no hepatosplenomegaly.   EXTREMITIES: He has got left leg swelling more than right leg.   MUSCULOSKELETAL: There is no erythema or swelling.   SKIN: He has erythema involving the left leg down from his knee on his shin. There is warmth on the left leg. Also, there is area of large blister on the left lateral aspect of the leg. Currently is not draining. Seems to have persistent fluid there, also warmth and erythema around this area as well.    LYMPHATICS: No lymphadenopathy noted.   NEUROLOGICAL: Cranial nerves II through XII grossly intact. Deep tendon reflexes intact. Sensation intact.   PSYCHIATRIC: Awake, alert, oriented x3.   LABORATORY, DIAGNOSTIC AND RADIOLOGICAL DATA: WBC 9.6, hemoglobin 13.9, platelet count 223. BMP: Glucose 101, BUN 21, creatinine 1.23, sodium 146, potassium 4.2, chloride 109, CO2 34. LFTs were normal except albumin of 3.0. Left foot x-ray shows osteopenia without evidence of acute osseous abnormality. PA and lateral shallow inspiration without evidence of acute cardiopulmonary disease.    ASSESSMENT AND PLAN: Patient is an 81 year old white male with history of Parkinson's disease, chronic obstructive pulmonary disease, diastolic congestive heart failure sent from primary M.D. for left leg cellulitis with area of blister on the left lateral aspect of the left heel.  1. Left leg cellulitis with a possible abscess. At this time will treat him with IV antibiotics with Unasyn, vancomycin. I will have podiatry evaluate the patient. He may need further drainage.  2. Shortness of breath likely due to acute chronic obstructive pulmonary disease exacerbation. Will place him on IV Solu-Medrol as well as nebulizers. Will check a BNP level. Continue his Lasix. His chest x-ray shows no acute cardiopulmonary processes.  3. Left leg swelling for about one year duration. Will make sure that there is no deep vein thrombosis.  4. Diabetes, diet controlled. Will place  him on sliding scale.  5. Parkinson's disease. He is currently not on any treatment for that.  6. Benign prostatic hypertrophy. Continue Flomax as taking at home.  7. History of diastolic congestive heart failure. Will check a BMP. His chest x-ray is negative. There is no evidence of acute diastolic congestive heart failure. Will follow his ins and outs.  8. Peripheral neuropathy.  9. Miscellaneous. Will place him on Lovenox for deep vein thrombosis prophylaxis.    TIME SPENT: 40 minutes.   ____________________________ Lacie Scotts Allena Katz, MD shp:cms D: 05/14/2012 17:39:31 ET T: 05/15/2012 05:51:26 ET JOB#: [id number]  cc: Kimber Esterly H. Allena Katz, MD, <Dictator> [facility] [place] Charise Carwin MD ELECTRONICALLY SIGNED 05/22/2012 12:13

## 2015-02-13 NOTE — Discharge Summary (Signed)
PATIENT NAME:  [name], [name] DATE OF BIRTH:  May 10, 1924  DATE OF ADMISSION:  10/30/2012 DATE OF DISCHARGE:  11/01/2012  ADMISSION DIAGNOSES:  1.  Right lower lobe pneumonia.  2.  Urinary tract infection.   DISCHARGE DIAGNOSES:  1.  Right lower lobe pneumonia, likely aspiration versus community-acquired pneumonia.  2.  Escherichia coli urinary tract infection.  3.  Chronic obstructive pulmonary disease.  4.  Dementia, Severe  5.  Diastolic congestive heart failure.  6.  History of obstructive sleep apnea.  7.  Parkinson disease.  8.  Type 2 diabetes.  9.  Peripheral neuropathy. 10.  Benign prostatic hypertrophy.  11.  Depression.  12.  Gastroesophageal reflux disease.   CONSULTATIONS: None.   LABORATORIES AT DISCHARGE: White blood cells 7.9, hemoglobin 10, hematocrit 32, platelets 205. Sodium 145, potassium 3.9, chloride 105, bicarbonate 35, BUN 22, creatinine 1.03, glucose 98. Urine culture showed E. coli sensitive to ciprofloxacin. Blood cultures are negative to date.   Chest x-ray showed right lower lobe airspace disease concerning for pneumonia.   HOSPITAL COURSE: This is an 81 year old male with severe dementia who was brought in with fever and was found to have a pneumonia. For further details, please refer to the H and P.  1.  Right lower lobe pneumonia, possibly aspiration versus community-acquired pneumonia. The patient does have aspiration due to severe dementia. I suspect that this is more likely related to his aspiration. He was placed on aspiration precautions and started on Levaquin. Blood cultures are negative to date. He was also started on nebulizers p.r.n. and speech therapy evaluated and they recommend dysphagia diet due to aspiration from his dementia.  2.  E. coli UTI. The patient was on Levaquin and will continue this as an outpatient.  3.  History of COPD, which is stable.  4.  Dementia, severe. The patient has hospice at home.  5.  Parkinson  disease.  6.  History of OSA. The patient did not tolerate CPAP in the past.  7.  Diastolic congestive heart failure, compensated with a normal ejection fraction. 8.  Type 2 diabetes, on no treatment.  9.  History of peripheral neuropathy, which was not treated at this time.  10.  History of BPH.  11.  GERD.   DISCHARGE MEDICATIONS:  1.  Citalopram 20 mg daily.  2.  Aspirin 81 mg daily.  3.  Advair Diskus 250/50 at bedtime.  4.  Lasix 40 mg 1/2 tablet to 1 tablet daily.  5.  Tamsulosin 0.4 mg at bedtime.  6.  QVAR 40 mcg 2 puffs b.i.d. p.r.n. wheezing.  7.  Vitamin B12, 1000 mcg daily.  8.  Seroquel 50 mg 1/2 tablet at bedtime.  9.  Prednisone taper.  10.  Bactroban 2% topical to affected area daily.  11.  Acetaminophen 325, 2 tablets q.4 hours p.r.n.  12.  Levaquin 750 mg q.24 hours x 5 days.   DISCHARGE HOME HEALTH: Yes with nursing.   DISCHARGE OXYGEN: 2 liters.   DISCHARGE DIET: Pureed, honey-thick liquids.meds in puree, crush if able, with aspiration precautions.   DISCHARGE REFERRAL: Hospice. The patient is medically stable for discharge.   TIME SPENT: Approximately 35 minutes.    ____________________________ Janyth ContesSital P. Juliene PinaMody, MD spm:jm D: 11/01/2012 13:35:04 ET T: 11/01/2012 15:16:03 ET JOB#: [id number]  cc: Sheryl Towell P. Juliene PinaMody, MD, <Dictator> Janyth ContesSITAL P Huan Pollok MD ELECTRONICALLY SIGNED 11/01/2012 19:50

## 2015-02-13 NOTE — H&P (Signed)
PATIENT NAME:  [name], [name] MR#:  [id number] DATE OF BIRTH:  [birth year]  DATE OF ADMISSION:  10/30/2012  PRIMARY CARE PHYSICIAN:  [facility] REFERRING PHYSICIAN:  Chiquita LothJade Sung, MD  CHIEF COMPLAINT: Cough and congestion x 2 weeks.   HISTORY OF PRESENT ILLNESS:  The patient is an 81 year old Caucasian male with his to home under the care of hospice and also the care of his family. He has dementia, parkinsonism and a history of chronic obstructive pulmonary disease and asthma. The patient is bedridden and requires total care. He is nonverbal, but mumble a few non-intelligent words. The family observed that he is having cough for the last 2 weeks, difficult for him to expectorate and it was hard to suction his throat with the help of the hospice. The family brought the patient to the hospital for further evaluation with the findings here with a chest x-ray that he has pneumonia in addition to presence of urinary tract infection. They finally would like him to have treatment in  inpatient setting.    REVIEW OF SYSTEMS:   A 10-point system review is unobtainable due to the  patient's dementia.   PAST MEDICAL HISTORY: 1.  Advanced dementia. 2.  Parkinson's disease. 3. Chronic obstructive pulmonary disease and asthma, home oxygen dependent 24 hours on oxygen 2 to 3 liters.  4.  Obstructive sleep apnea syndrome. He is intolerant to CPAP treatment.  5.  The patient also has history of diastolic congestive heart failure, but normal ejection fraction of more than 60%.  6.  Type 2 diabetes mellitus, but on no medications currently.  7.  Peripheral neuropathy. He had ambulatory gait dysfunction and parkinsonism. Again he is bedridden.  8.  History of gastroesophageal reflux disease.   PAST SURGICAL HISTORY:  Bilateral knee joint replacement in 2003 and 2004. He had back surgery and cataract surgery.   SOCIAL HABITS:  Nonsmoker, never smoked. No history of alcohol or drug abuse.   SOCIAL HISTORY: He is  married, living with his wife. He is under the care of hospice. 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  FAMILY HISTORY: His father had stroke and he died at age of 79. His mother died at the age of 79 and she had breast cancer.   ADMISSION MEDICATIONS:  Tamsulosin 0.4 mg once a day. Seroquel 50 mg, 1 tablet at night. QVAR inhalation p.r.n.  Advair Discus 250/50. This is also use p.r.n.  The patient does not use it every day. He is also on citalopram 20 mg once a day and aspirin 81 mg a day.   ALLERGIES: No known drug allergies.   PHYSICAL EXAMINATION: VITAL SIGNS: Blood pressure 137/75, respiratory rate 24, pulse 105, temperature 99.7, oxygen saturation 95% while on oxygen.  GENERAL APPEARANCE: Elderly male laying in bed in deep sleep, does not communicate verbally. HEAD AND NECK: No pallor. No icterus. No cyanosis.  ENT: Ear examination revealed no ulcers, no discharge. Could not assess hearing due to the patient's mental condition. Nasal mucosa appears normal without discharge. No ulcers. Mucosa was normal without any ulcers, no oral thrush.  EYES: Normal eyelids and conjunctiva. Pupils about 4 to 5 mm, reactive to light. The pupils were round.  NECK: Supple. Trachea at midline. No thyromegaly. No significant lymphadenopathy. No masses.  HEART: Normal S1, S2. No S3 or S4. No murmur. No gallop. No carotid bruits.  LUNGS: Normal breathing pattern without use of accessory muscles. No rales. No  wheezing.  ABDOMEN: Soft without tenderness. No rigidity. No rebound. No guarding. No hepatosplenomegaly.  MUSCULOSKELETAL: No joint swelling. No clubbing.  SKIN: No ulcers. No subcutaneous nodules.  NEUROLOGIC: Examination is limited exam due to the patient's advanced dementia.  There is no facial asymmetry. He is moving his upper extremities. Little movements, if any, of lower extremities.   PSYCHIATRIC: Unobtainable due to the patient's advanced dementia.   LABORATORY AND DIAGNOSTIC  DATA:  A chest x-ray showed right lower lobe pneumonia. His urinalysis showed cloudy urine, +3 leukocyte esterase, 201 white blood cells, 6 RBCs, +3 bacteria. CBC showed white count of 13,000, hemoglobin 11.5, hematocrit 38, platelet count 221. Troponin less than 0.02. Serum glucose 174, BUN 16, creatinine 1.1, sodium 143, potassium 4.1. Liver function tests and transaminases were normal except for the albumin 2.6.   ASSESSMENT: 1.  Right lower lobe pneumonia.  2.  Urinary tract infection.  3.  Chronic obstructive pulmonary disease. 4.  Dementia.  5.  Parkinson's disease.  6.  Obstructive sleep apneas syndrome, did not tolerate CPAP in the past. 7.  Diastolic congestive heart failure which is compensated with normal ejection fraction of 60%. 8.  Type 2 diabetes mellitus on no treatment.  9.  Peripheral neuropathy. 10.  Benign prostatic hypertrophy. 11.  Depression. 12.  Gastroesophageal reflux disease.   PLAN: We will admit the patient to the medical floor. Blood cultures x 2 were taken. I also ordered urine culture. IV Levaquin.  DuoNebs q.4 hours p.r.n. Oxygen supplementation. Continue home medications as listed above. Ativan p.r.n. for agitation as the family is doing at home.   His CODE STATUS is DO NOT RESUSCITATE.   Time spent in evaluating this patient, reviewing medical records and discussion with his wife and his daughter-in-law took more than 55 minutes.   ____________________________ Carney Corners. Rudene Re, MD amd:ct D: 10/30/2012 02:09:38 ET T: 10/30/2012 08:59:16 ET JOB#: [id number]  cc: Carney Corners. Rudene Re, MD, <Dictator> Zollie Scale MD ELECTRONICALLY SIGNED 11/01/2012 22:48
